# Patient Record
Sex: Female | Born: 1995 | Race: Black or African American | Hispanic: No | Marital: Single | Smoking: Never smoker
Health system: Southern US, Community
[De-identification: ages and names within clinical notes are randomized; demographics above are authoritative.]

## PROBLEM LIST (undated history)

## (undated) DIAGNOSIS — F913 Oppositional defiant disorder: Secondary | ICD-10-CM

## (undated) HISTORY — DX: Oppositional defiant disorder: F91.3

---
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
## 2016-01-25 ENCOUNTER — Emergency Department (HOSPITAL_COMMUNITY)
Admission: EM | Admit: 2016-01-25 | Discharge: 2016-01-25 | Disposition: A | Discharge status: Home / Self Care | Payer: BLUE CROSS/BLUE SHIELD | Attending: Emergency Medicine | Admitting: Emergency Medicine

## 2016-01-25 ENCOUNTER — Emergency Department (HOSPITAL_COMMUNITY): Payer: BLUE CROSS/BLUE SHIELD

## 2016-01-25 DIAGNOSIS — Y9289 Other specified places as the place of occurrence of the external cause: Secondary | ICD-10-CM | POA: Diagnosis not present

## 2016-01-25 DIAGNOSIS — W25XXXA Contact with sharp glass, initial encounter: Secondary | ICD-10-CM | POA: Insufficient documentation

## 2016-01-25 DIAGNOSIS — Y9389 Activity, other specified: Secondary | ICD-10-CM | POA: Insufficient documentation

## 2016-01-25 DIAGNOSIS — Z8659 Personal history of other mental and behavioral disorders: Secondary | ICD-10-CM | POA: Insufficient documentation

## 2016-01-25 DIAGNOSIS — Z792 Long term (current) use of antibiotics: Secondary | ICD-10-CM | POA: Insufficient documentation

## 2016-01-25 DIAGNOSIS — IMO0002 Reserved for concepts with insufficient information to code with codable children: Secondary | ICD-10-CM

## 2016-01-25 DIAGNOSIS — Z23 Encounter for immunization: Secondary | ICD-10-CM | POA: Insufficient documentation

## 2016-01-25 DIAGNOSIS — Y998 Other external cause status: Secondary | ICD-10-CM | POA: Insufficient documentation

## 2016-01-25 DIAGNOSIS — S99921A Unspecified injury of right foot, initial encounter: Secondary | ICD-10-CM | POA: Diagnosis present

## 2016-01-25 DIAGNOSIS — S91311A Laceration without foreign body, right foot, initial encounter: Secondary | ICD-10-CM | POA: Insufficient documentation

## 2016-01-25 MED ORDER — CEPHALEXIN 500 MG PO CAPS: 500.0000 mg | ORAL_CAPSULE | Freq: Two times a day (BID) | ORAL | Status: DC

## 2016-01-25 MED ORDER — TETANUS-DIPHTH-ACELL PERTUSSIS 5-2.5-18.5 LF-MCG/0.5 IM SUSP
0.5000 mL | Freq: Once | INTRAMUSCULAR | Status: AC
  Administered 2016-01-25: 0.5 mL via INTRAMUSCULAR
  Filled 2016-01-25: qty 0.5

## 2016-01-25 MED ORDER — LIDOCAINE-EPINEPHRINE 2 %-1:100000 IJ SOLN: 20.0000 mL | Freq: Once | INTRAMUSCULAR | Status: DC

## 2016-01-25 MED ORDER — CEPHALEXIN 500 MG PO CAPS
500.0000 mg | ORAL_CAPSULE | Freq: Once | ORAL | Status: AC
  Administered 2016-01-25: 500 mg via ORAL
  Filled 2016-01-25: qty 1

## 2016-01-25 MED ORDER — LIDOCAINE-EPINEPHRINE 1 %-1:100000 IJ SOLN
INTRAMUSCULAR | Status: AC
  Administered 2016-01-25: 03:00:00
  Filled 2016-01-25: qty 1

## 2016-01-25 NOTE — ED Provider Notes (Signed)
CSN: [id number]     Arrival date & time 01/25/16  0013 History   First MD Initiated Contact with Patient 01/25/16 0145     Chief Complaint  Patient presents with  . Extremity Laceration    HPI   20 year old female presents today with a laceration to her right foot. Patient reports she stepped on glass this evening prior to arrival. Unknown tetanus, bleeding controlled. No other injuries noted. Patient is able ambulate without significant difficulty. No meds prior to arrival  Past Medical History  Diagnosis Date  . Oppositional defiant disorder    No past surgical history on file. Family History  Problem Relation Age of Onset  . Depression Father   . Dementia Maternal Grandmother   . Depression Maternal Grandmother   . Depression Paternal Grandmother    Social History  Substance Use Topics  . Smoking status: Never Smoker   . Smokeless tobacco: Not on file  . Alcohol Use: No   OB History    No data available     Review of Systems  All other systems reviewed and are negative.   Allergies  Review of patient's allergies indicates no known allergies.  Home Medications   Prior to Admission medications   Medication Sig Start Date End Date Taking? Authorizing Provider  amoxicillin (AMOXIL) 250 MG capsule Take 1 capsule (250 mg total) by mouth 3 (three) times daily. 02/23/14   Sheryl L Mindi Junker, DO  cephALEXin (KEFLEX) 500 MG capsule Take 1 capsule (500 mg total) by mouth 2 (two) times daily. 01/25/16   Eyvonne Mechanic, PA-C  ibuprofen (ADVIL,MOTRIN) 600 MG tablet Take 1 tablet (600 mg total) by mouth every 6 (six) hours as needed. 05/17/14   Earley Favor, NP   BP 107/67 mmHg  Pulse 73  Temp(Src) 98.4 F (36.9 C) (Oral)  Resp 18  SpO2 98%  LMP  (LMP Unknown)   Physical Exam  Constitutional: She is oriented to person, place, and time. She appears well-developed and well-nourished.  HENT:  Head: Normocephalic and atraumatic.  Eyes: Conjunctivae are normal. Pupils are equal,  round, and reactive to light. Right eye exhibits no discharge. Left eye exhibits no discharge. No scleral icterus.  Neck: Normal range of motion. No JVD present. No tracheal deviation present.  Pulmonary/Chest: Effort normal. No stridor.  Neurological: She is alert and oriented to person, place, and time. Coordination normal.  Skin: Skin is warm and dry. No rash noted. No erythema. No pallor.  Laceration noted to the right lateral foot approximately 3 cm; bleeding controlled no signs of deep space involvement  Psychiatric: She has a normal mood and affect. Her behavior is normal. Judgment and thought content normal.  Nursing note and vitals reviewed.   ED Course  Procedures (including critical care time) Labs Review Labs Reviewed - No data to display  Imaging Review Dg Foot Complete Right  01/25/2016  CLINICAL DATA:  Cut lateral aspect of her right foot on glass tonight. EXAM: RIGHT FOOT COMPLETE - 3+ VIEW COMPARISON:  None. FINDINGS: Negative for fracture dislocation. No radiopaque foreign body is evident. No soft tissue gas. IMPRESSION: Negative. Electronically Signed   By: Ellery Plunk M.D.   On: 01/25/2016 00:40   I have personally reviewed and evaluated these images and lab results as part of my medical decision-making.   EKG Interpretation None      MDM   Final diagnoses:  Laceration    Labs:  Imaging: DG for complete right- negative  Consults:  Therapeutics:T  dap, lidocaine  Discharge Meds:   Assessment/Plan:  An 20 year old female with a laceration to her foot. Patient initially refused any intervention. Patient allowed one attempt that anesthesia of the foot as she has a 3 cm laceration to the lateral aspect of the foot requiring approximation. Patient would not allow for proper anesthesia used with the assistance of nursing staff holding her foot. I was able to inject approximately 0.5 mL of lidocaine into the affected area, even without touching the patient's  foot she reported that it was painful when I moved towards it. After numerous attempts at laceration repair patient reported that she wanted to go home without the foot repaired. I informed her that by leaving this wound open and there was greater chance of infection, patient was aware of this and would monitor for signs of infection and return if any present. Patient will be placed on prophylactic antibiotics. Patient's wound was approximated using Steri-Strips as best possible. Option of gluing the wound together was was not an option as patient would not allow me to approximate the edges of the wound. She is given strict return precautions, she verbalized understanding and agreement today's plan.         Eyvonne MechanicJeffrey Meeya Goldin, PA-C 01/25/16 [phone]  Geoffery Lyonsouglas Delo, MD 01/25/16 [phone]

## 2016-01-25 NOTE — ED Notes (Signed)
Pt reports understanding of discharge information. No questions at time of discharge 

## 2016-01-25 NOTE — ED Notes (Addendum)
Pt left room stating that she was going home. Pt was still awaiting to be seen by provider. Provider notified.

## 2016-01-25 NOTE — ED Notes (Signed)
Pt states she cut her R lateral foot on glass tonight. Unknown tetanus status. Alert and oriented. Bleeding controlled.

## 2016-01-25 NOTE — ED Notes (Signed)
Pt returned to room after initially stating that she wanted to leave. Provider notified.

## 2016-01-25 NOTE — ED Notes (Signed)
Suture cart at bedside 

## 2016-12-20 IMAGING — CR DG FOOT COMPLETE 3+V*R*
3 series · 3 of 3 positions shown · non-contrast
Comparison: None.

CLINICAL DATA: Cut lateral aspect of her right foot on glass
tonight.

EXAM:
RIGHT FOOT COMPLETE - 3+ VIEW

[x foot ap right]
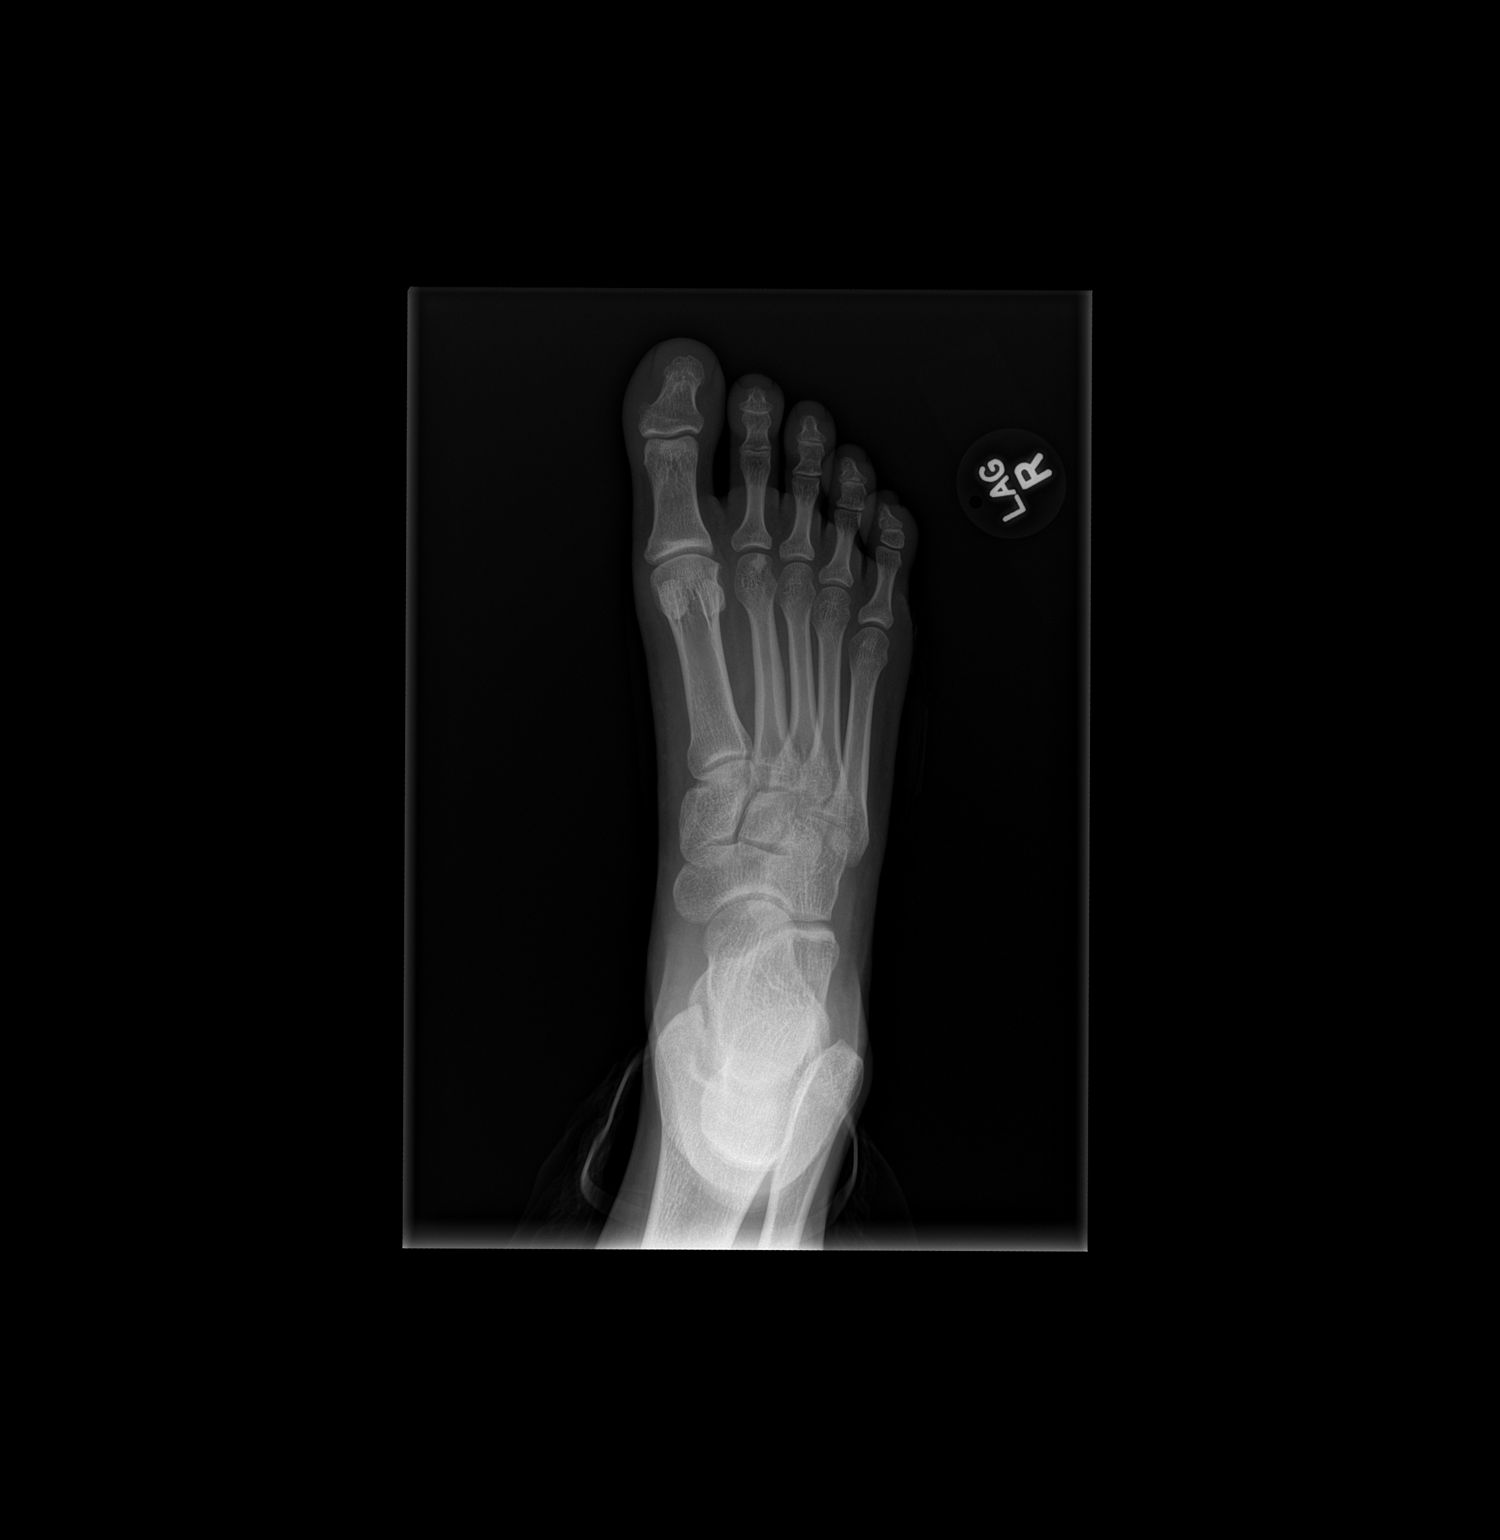

[x foot obl right]
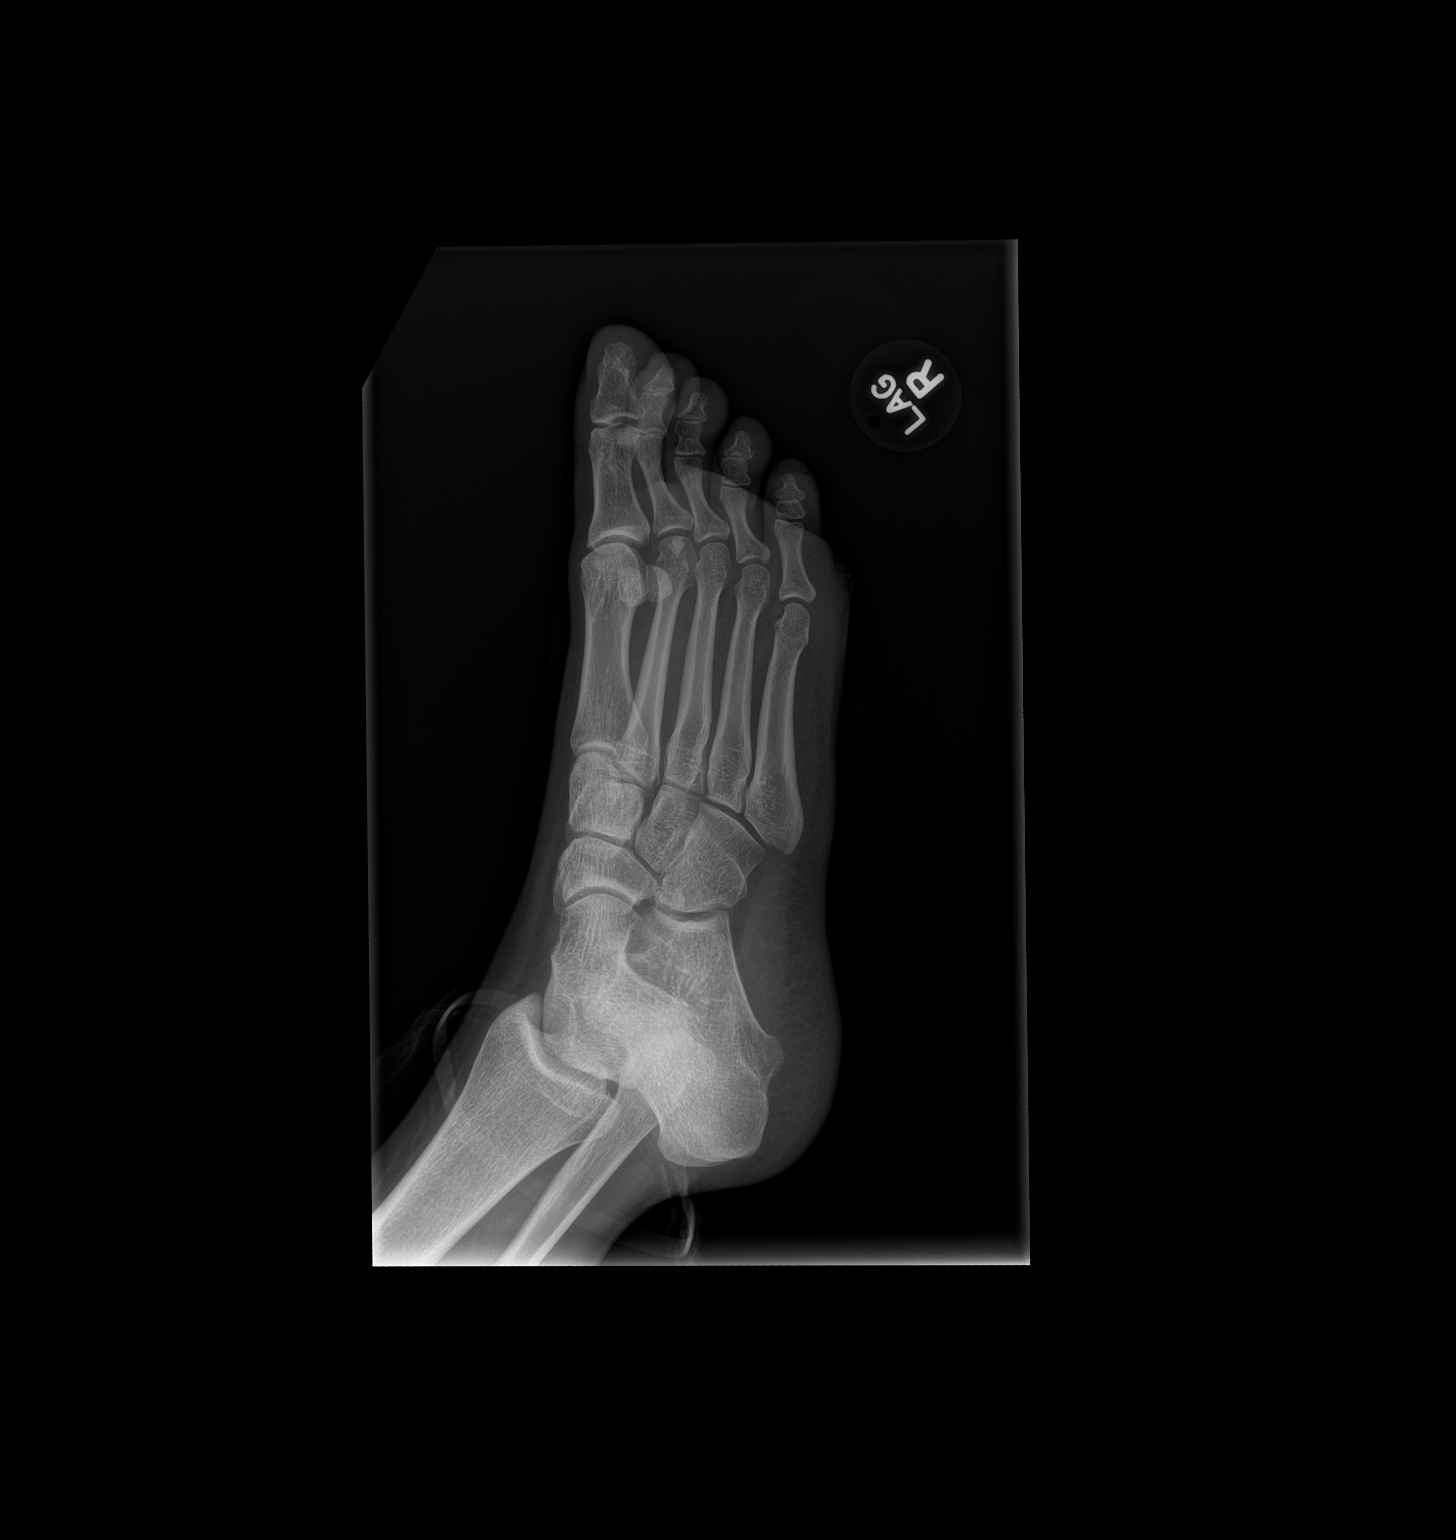

[x foot lat right]
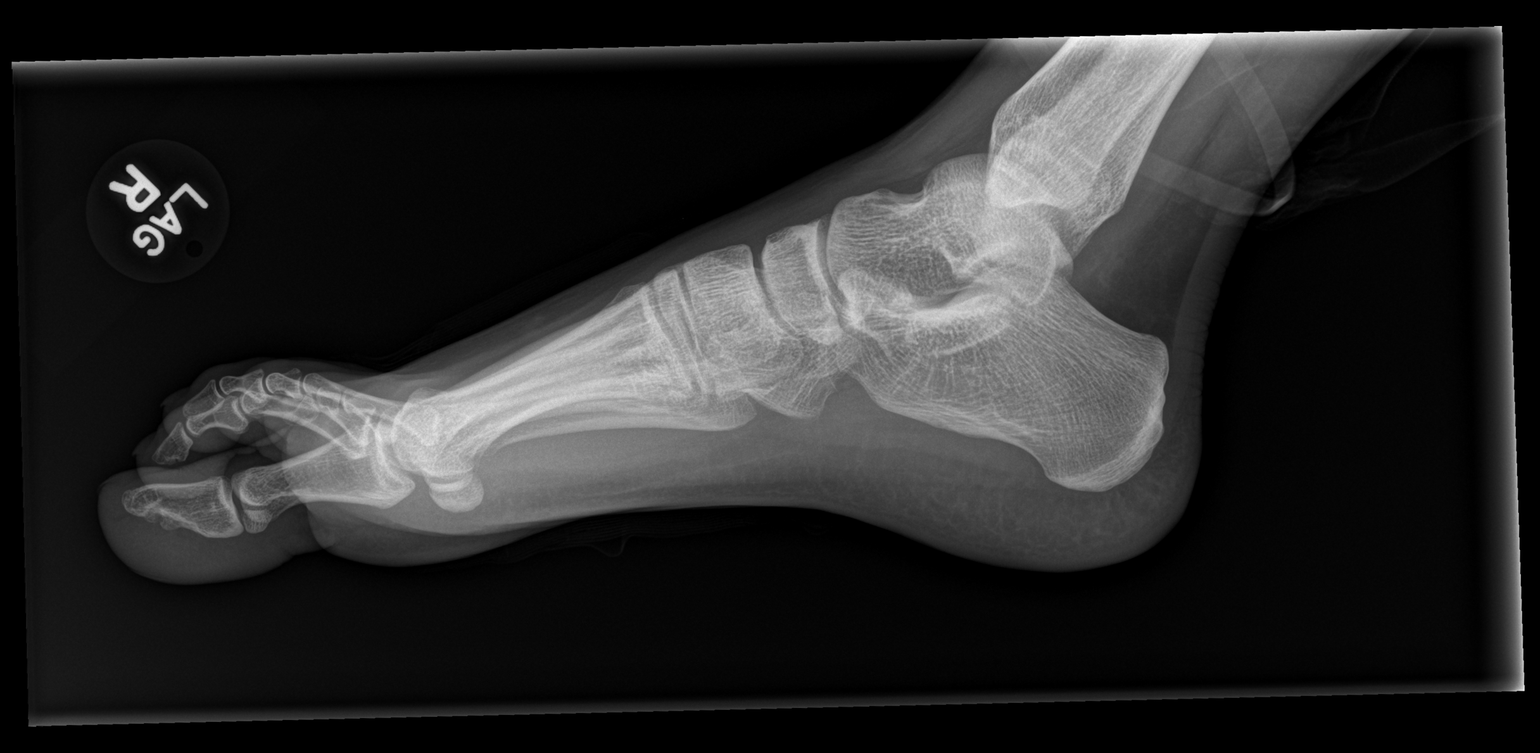

[3 of 3 positions shown; findings below may reference images not displayed]

FINDINGS: Negative for fracture dislocation. No radiopaque foreign body is
evident. No soft tissue gas.
IMPRESSION: Negative.

## 2018-11-17 ENCOUNTER — Ambulatory Visit (HOSPITAL_COMMUNITY)
Admission: EM | Admit: 2018-11-17 | Discharge: 2018-11-17 | Disposition: A | Discharge status: Home / Self Care | Payer: BLUE CROSS/BLUE SHIELD | Attending: Internal Medicine | Admitting: Internal Medicine

## 2018-11-17 ENCOUNTER — Encounter (HOSPITAL_COMMUNITY): Payer: Self-pay | Admitting: Emergency Medicine

## 2018-11-17 DIAGNOSIS — R51 Headache: Secondary | ICD-10-CM | POA: Diagnosis not present

## 2018-11-17 DIAGNOSIS — R519 Headache, unspecified: Secondary | ICD-10-CM
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
